# Patient Record
Sex: Male | Born: 1970 | Race: White | Hispanic: No | Smoking: Former smoker
Health system: Southern US, Community
[De-identification: ages and names within clinical notes are randomized; demographics above are authoritative.]

## PROBLEM LIST (undated history)

## (undated) DIAGNOSIS — F419 Anxiety disorder, unspecified: Secondary | ICD-10-CM

## (undated) DIAGNOSIS — I10 Essential (primary) hypertension: Secondary | ICD-10-CM

## (undated) HISTORY — DX: Anxiety disorder, unspecified: F41.9

## (undated) HISTORY — DX: Essential (primary) hypertension: I10

---

## 2000-05-14 ENCOUNTER — Emergency Department (HOSPITAL_COMMUNITY): Admission: EM | Admit: 2000-05-14 | Discharge: 2000-05-14 | Payer: Self-pay | Admitting: Emergency Medicine

## 2002-03-01 ENCOUNTER — Emergency Department (HOSPITAL_COMMUNITY): Admission: EM | Admit: 2002-03-01 | Discharge: 2002-03-02 | Payer: Self-pay

## 2002-03-11 ENCOUNTER — Emergency Department (HOSPITAL_COMMUNITY): Admission: EM | Admit: 2002-03-11 | Discharge: 2002-03-11 | Payer: Self-pay | Admitting: Emergency Medicine

## 2002-06-23 ENCOUNTER — Emergency Department (HOSPITAL_COMMUNITY): Admission: EM | Admit: 2002-06-23 | Discharge: 2002-06-23 | Payer: Self-pay | Admitting: Emergency Medicine

## 2005-04-30 ENCOUNTER — Emergency Department (HOSPITAL_COMMUNITY): Admission: EM | Admit: 2005-04-30 | Discharge: 2005-04-30 | Payer: Self-pay | Admitting: Emergency Medicine

## 2009-09-23 ENCOUNTER — Emergency Department (HOSPITAL_COMMUNITY): Admission: EM | Admit: 2009-09-23 | Discharge: 2009-09-23 | Payer: Self-pay | Admitting: Emergency Medicine

## 2010-05-26 IMAGING — CR DG CHEST 1V PORT
2 series · 2 of 2 positions shown · non-contrast
Comparison: None

CLINICAL DATA: Palpitations.

PORTABLE CHEST - 1 VIEW

[view not recorded (1 of 2)]
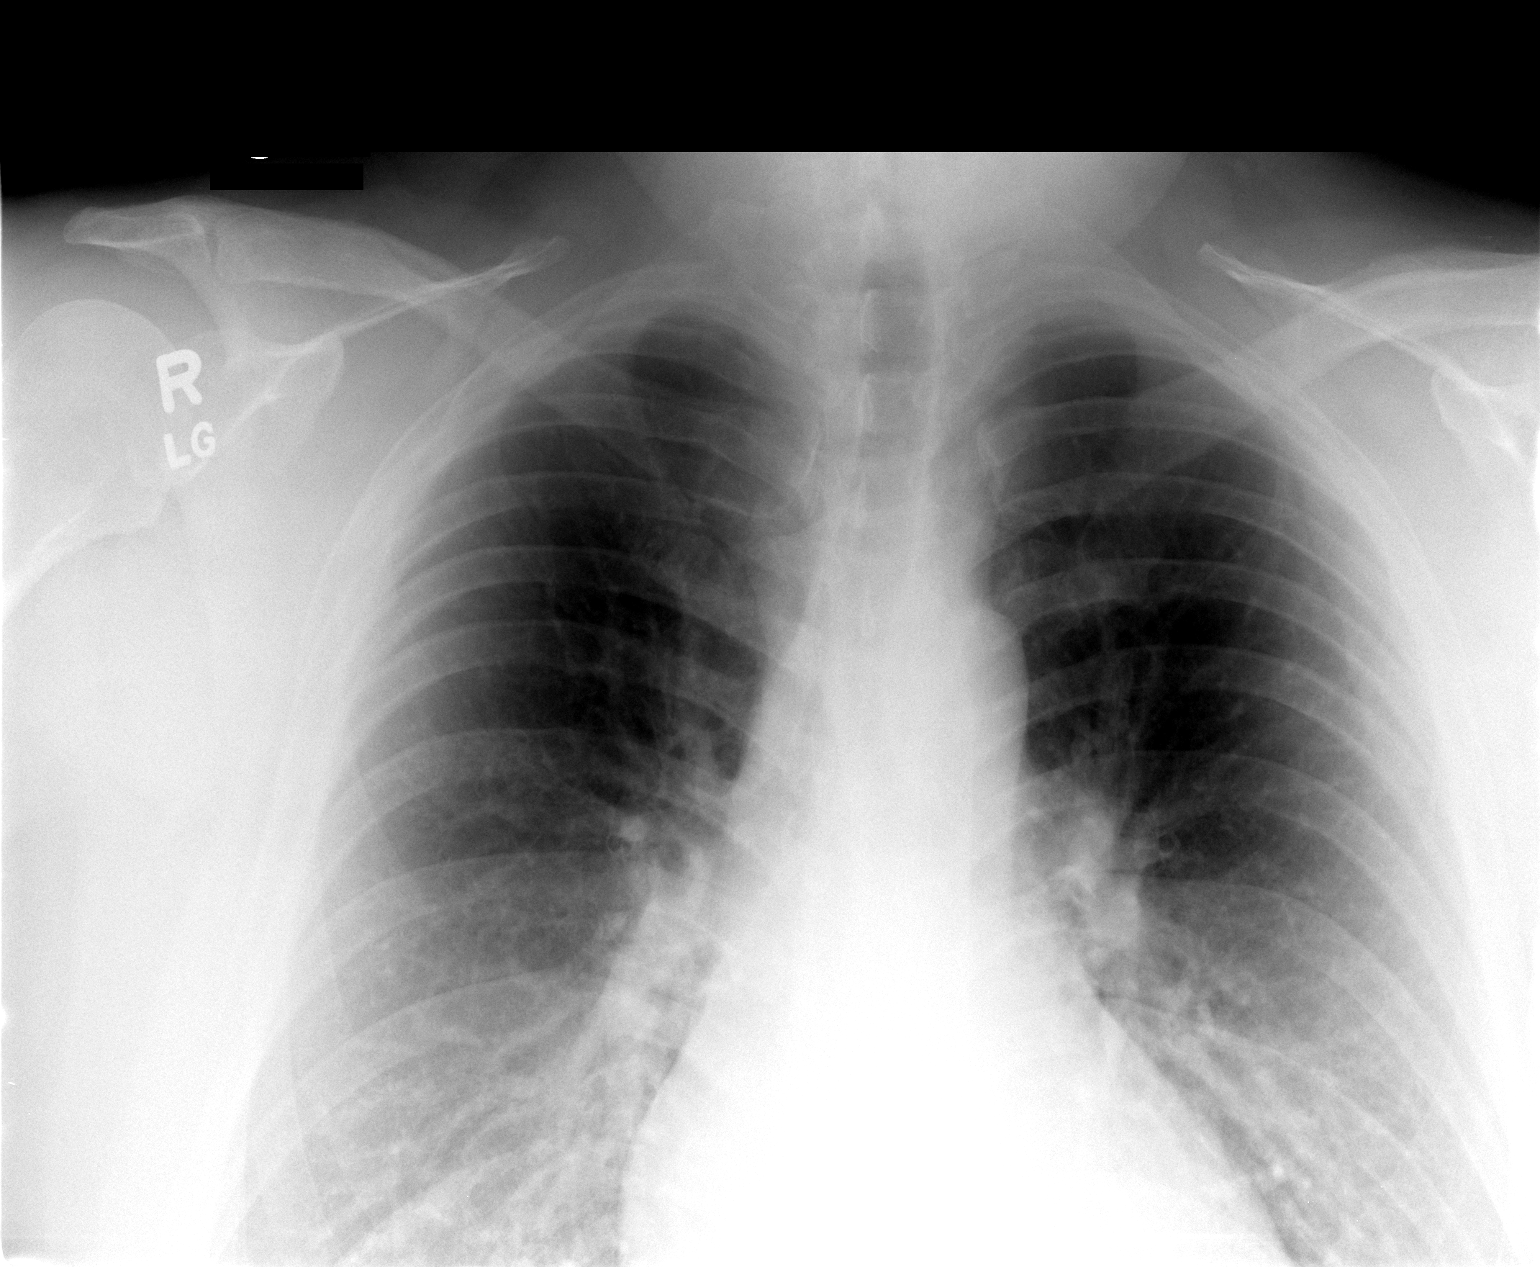

[view not recorded (2 of 2)]
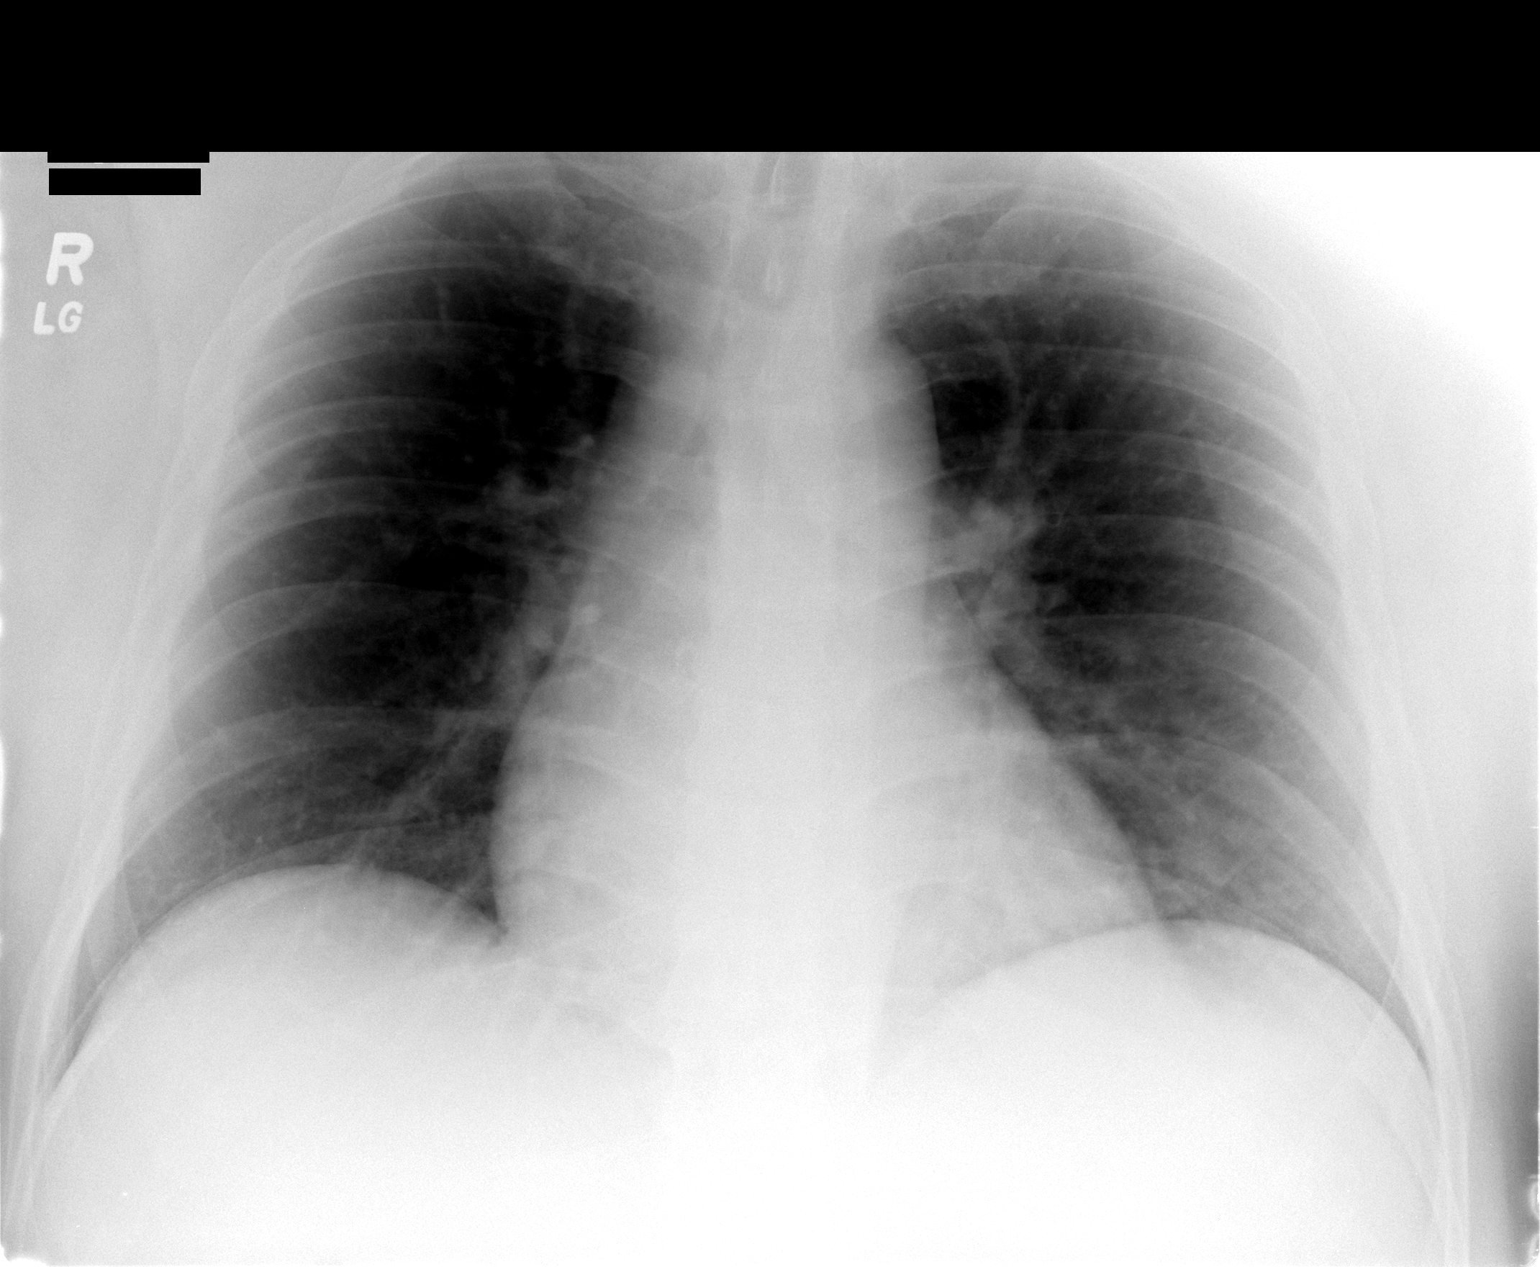

[2 of 2 positions shown; findings below may reference images not displayed]

FINDINGS: The lungs are well-aerated and clear.  There is no
evidence of focal opacification, pleural effusion or pneumothorax.

The cardiomediastinal silhouette appears mildly prominent, but this
may be within normal limits given the patient's size.  No acute
osseous abnormalities are seen.
IMPRESSION: No acute cardiopulmonary process seen.

## 2011-01-28 LAB — POCT I-STAT, CHEM 8
BUN: 13 mg/dL (ref 6–23)
Calcium, Ion: 1.2 mmol/L (ref 1.12–1.32)
Chloride: 103 mEq/L (ref 96–112)
Creatinine, Ser: 0.9 mg/dL (ref 0.4–1.5)
Glucose, Bld: 110 mg/dL — ABNORMAL HIGH (ref 70–99)
HCT: 39 % (ref 39.0–52.0)
Hemoglobin: 13.3 g/dL (ref 13.0–17.0)
Potassium: 3.5 mEq/L (ref 3.5–5.1)
Sodium: 140 mEq/L (ref 135–145)
TCO2: 24 mmol/L (ref 0–100)

## 2011-01-28 LAB — POCT CARDIAC MARKERS
CKMB, poc: <1 ng/mL — ABNORMAL LOW (ref 1.0–8.0)
CKMB, poc: <1 ng/mL — ABNORMAL LOW (ref 1.0–8.0)
Myoglobin, poc: 46.8 ng/mL (ref 12–200)
Myoglobin, poc: 64.1 ng/mL (ref 12–200)
Troponin i, poc: <0.05 ng/mL (ref 0.00–0.09)
Troponin i, poc: <0.05 ng/mL (ref 0.00–0.09)

## 2011-11-05 ENCOUNTER — Ambulatory Visit (INDEPENDENT_AMBULATORY_CARE_PROVIDER_SITE_OTHER): Payer: PRIVATE HEALTH INSURANCE

## 2011-11-05 DIAGNOSIS — Z23 Encounter for immunization: Secondary | ICD-10-CM

## 2011-11-05 DIAGNOSIS — M546 Pain in thoracic spine: Secondary | ICD-10-CM

## 2011-11-05 DIAGNOSIS — M62838 Other muscle spasm: Secondary | ICD-10-CM

## 2012-02-22 ENCOUNTER — Telehealth: Payer: Self-pay | Admitting: Internal Medicine

## 2012-02-22 MED ORDER — CLONAZEPAM 1 MG PO TABS: 1.0000 mg | ORAL_TABLET | Freq: Two times a day (BID) | ORAL | Status: DC | PRN

## 2012-02-22 NOTE — Telephone Encounter (Signed)
Refilled clonazepam 1 mg #30 no refills, due for recheck in May, script will be faxed.

## 2012-03-01 ENCOUNTER — Other Ambulatory Visit: Payer: Self-pay | Admitting: Physician Assistant

## 2012-03-01 MED ORDER — CLONAZEPAM 1 MG PO TABS: 1.0000 mg | ORAL_TABLET | Freq: Two times a day (BID) | ORAL | Status: DC | PRN

## 2012-05-21 ENCOUNTER — Other Ambulatory Visit: Payer: Self-pay | Admitting: Physician Assistant

## 2012-05-31 ENCOUNTER — Other Ambulatory Visit: Payer: Self-pay | Admitting: Physician Assistant

## 2012-05-31 MED ORDER — CLONAZEPAM 1 MG PO TABS: 1.0000 mg | ORAL_TABLET | Freq: Two times a day (BID) | ORAL | Status: DC | PRN

## 2012-09-01 ENCOUNTER — Other Ambulatory Visit: Payer: Self-pay | Admitting: Physician Assistant

## 2012-09-02 NOTE — Telephone Encounter (Signed)
Chart pulled to PA pool at nurses station [phone]

## 2012-09-03 ENCOUNTER — Other Ambulatory Visit: Payer: Self-pay | Admitting: *Deleted

## 2012-09-29 ENCOUNTER — Other Ambulatory Visit: Payer: Self-pay | Admitting: Physician Assistant

## 2012-10-11 ENCOUNTER — Ambulatory Visit (INDEPENDENT_AMBULATORY_CARE_PROVIDER_SITE_OTHER): Payer: PRIVATE HEALTH INSURANCE | Admitting: Physician Assistant

## 2012-10-11 VITALS — BP 121/75 | HR 80 | Temp 98.4°F | Resp 18 | Wt 277.0 lb

## 2012-10-11 DIAGNOSIS — F411 Generalized anxiety disorder: Secondary | ICD-10-CM

## 2012-10-11 DIAGNOSIS — E669 Obesity, unspecified: Secondary | ICD-10-CM

## 2012-10-11 DIAGNOSIS — F419 Anxiety disorder, unspecified: Secondary | ICD-10-CM

## 2012-10-11 DIAGNOSIS — Z719 Counseling, unspecified: Secondary | ICD-10-CM

## 2012-10-11 MED ORDER — CLONAZEPAM 1 MG PO TABS: 1.0000 mg | ORAL_TABLET | Freq: Two times a day (BID) | ORAL | Status: DC | PRN

## 2012-10-12 NOTE — Progress Notes (Signed)
  Subjective:    Patient ID: Richard Mcgrath, male    DOB: 07/31/1971, 41 y.o.   MRN: [id number]  HPI 41 yr old male presents for anxiety. He had been dating someone for 5 months and they were engaged to be married. And they broke up 2 days ago.  He is coping really well, but he is getting low on his clonazepam.  He is currently taking 2 tablets daily.  Eventually he would like to be off of it completely.  He has not been exercising or eating healthy.  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  He has kept this up.  Denies SI/HI. He gets his bloodwork done at his company and sends me copies.  Review of Systems  All other systems reviewed and are negative.       Objective:   Physical Exam  Nursing note and vitals reviewed. Constitutional: He is oriented to person, place, and time. He appears well-developed and well-nourished.  HENT:  Head: Normocephalic and atraumatic.  Cardiovascular: Normal rate.   Pulmonary/Chest: Effort normal.  Neurological: He is alert and oriented to person, place, and time.  Skin: Skin is warm and dry.  Psychiatric: He has a normal mood and affect. His behavior is normal. Thought content normal.      Assessment & Plan:  Anxiety-counseled and was face to face for >45 mins.  Advised disciplines such as exercise, proper eating, meditation and deep breathing for relaxation, resume formal counseling,etc.  Ok for clonazepam for 6 months. He will send me his bloodwork.

## 2012-10-30 ENCOUNTER — Other Ambulatory Visit: Payer: Self-pay | Admitting: Physician Assistant

## 2012-10-30 MED ORDER — OSELTAMIVIR PHOSPHATE 75 MG PO CAPS: 75.0000 mg | ORAL_CAPSULE | Freq: Two times a day (BID) | ORAL | Status: DC

## 2012-10-30 NOTE — Progress Notes (Signed)
Patient exposed to flu in our office 2 weeks ago. Developed fever, cough, congestion, sneezing, sore throat acutely yesterday. Patient with come in to be seen if he doesn't improve in 48 hrs, sooner if worse.

## 2012-11-01 ENCOUNTER — Ambulatory Visit (INDEPENDENT_AMBULATORY_CARE_PROVIDER_SITE_OTHER): Payer: PRIVATE HEALTH INSURANCE | Admitting: Physician Assistant

## 2012-11-01 ENCOUNTER — Encounter: Payer: Self-pay | Admitting: Physician Assistant

## 2012-11-01 VITALS — BP 128/82 | HR 67 | Temp 98.4°F | Resp 18 | Ht 75.0 in | Wt 277.0 lb

## 2012-11-01 DIAGNOSIS — J111 Influenza due to unidentified influenza virus with other respiratory manifestations: Secondary | ICD-10-CM

## 2012-11-01 DIAGNOSIS — J029 Acute pharyngitis, unspecified: Secondary | ICD-10-CM

## 2012-11-01 DIAGNOSIS — F419 Anxiety disorder, unspecified: Secondary | ICD-10-CM

## 2012-11-01 DIAGNOSIS — F411 Generalized anxiety disorder: Secondary | ICD-10-CM

## 2012-11-01 DIAGNOSIS — R509 Fever, unspecified: Secondary | ICD-10-CM

## 2012-11-01 LAB — POCT CBC
Granulocyte percent: 48 %G (ref 37–80)
MID (cbc): 0.5 (ref 0–0.9)
MPV: 9.2 fL (ref 0–99.8)
POC Granulocyte: 3.5 (ref 2–6.9)
POC MID %: 6.4 %M (ref 0–12)
Platelet Count, POC: 328 10*3/uL (ref 142–424)
RBC: 4.14 M/uL — AB (ref 4.69–6.13)

## 2012-11-01 NOTE — Progress Notes (Signed)
  Subjective:    Patient ID: Richard Mcgrath, male    DOB: 1971-02-06, 42 y.o.   MRN: [id number]  HPI 42 yr old CM presents with a 4-5 day h/o ST, congestion, slight cough, mild sneezing, and extreme fatigue.  First 2 days of illness he had a fever ~100.5.  I saw him in our office about 2 weeks ago for an anxiety check.  He was possibly exposed to flu at work and was possibly exposed in the office due to high number of flu cases.  He called to the office on Sunday and I empirically started him on Tamiflu based on his symptoms bc he had been sick just over 24 hrs.  He presents today with continued sore throat and fatigue.  He did have a flu shot in about October 2013.  He has not had any N/V/D. Denies SOB.  Review of Systems  All other systems reviewed and are negative.       Objective:   Physical Exam  Nursing note and vitals reviewed. Constitutional: He is oriented to person, place, and time. He appears well-developed and well-nourished.  HENT:  Head: Normocephalic.  Right Ear: External ear normal.  Left Ear: External ear normal.  Mouth/Throat: No oropharyngeal exudate (throat with erythema and no exudate).  Neck: Normal range of motion. Neck supple.  Cardiovascular: Normal rate, regular rhythm and normal heart sounds.   Pulmonary/Chest: Effort normal and breath sounds normal. No respiratory distress. He has no wheezes. He has no rales.  Lymphadenopathy:    He has no cervical adenopathy.  Neurological: He is alert and oriented to person, place, and time.  Skin: Skin is warm and dry.  Psychiatric: He has a normal mood and affect. His behavior is normal.     Results for orders placed in visit on 11/01/12  POCT CBC      Component Value Range   WBC 7.3  4.6 - 10.2 K/uL   Lymph, poc 3.3  0.6 - 3.4   POC LYMPH PERCENT 45.6  10 - 50 %L   MID (cbc) 0.5  0 - 0.9   POC MID % 6.4  0 - 12 %M   POC Granulocyte 3.5  2 - 6.9   Granulocyte percent 48.0  37 - 80 %G   RBC 4.14 (*) 4.69 - 6.13  M/uL   Hemoglobin 12.9 (*) 14.1 - 18.1 g/dL   HCT, POC 69.6 (*) 29.5 - 53.7 %   MCV 99.3 (*) 80 - 97 fL   MCH, POC 31.2  27 - 31.2 pg   MCHC 31.4 (*) 31.8 - 35.4 g/dL   RDW, POC 28.4     Platelet Count, POC 328  142 - 424 K/uL   MPV 9.2  0 - 99.8 fL  POCT RAPID STREP A (OFFICE)      Component Value Range   Rapid Strep A Screen Negative  Negative   Recheck pulse ox 97%     Assessment & Plan:  Resolving flu-continue to push fluids, rest.  Continue tamiflu.

## 2012-11-02 ENCOUNTER — Other Ambulatory Visit: Payer: Self-pay | Admitting: Physician Assistant

## 2012-11-08 ENCOUNTER — Other Ambulatory Visit: Payer: Self-pay

## 2012-11-08 ENCOUNTER — Other Ambulatory Visit: Payer: Self-pay | Admitting: Physician Assistant

## 2012-11-08 MED ORDER — DIPHENHYD-HYDROCORT-NYSTATIN MT SUSP: 5.0000 mL | Freq: Two times a day (BID) | OROMUCOSAL | Status: DC

## 2012-11-08 NOTE — Progress Notes (Signed)
Patient calls in, and has had bouts of oral thrush his whole adult life.  Rx sent.  RTC if it doesn't help, patient agrees.

## 2013-01-09 ENCOUNTER — Other Ambulatory Visit: Payer: Self-pay | Admitting: Physician Assistant

## 2013-01-09 NOTE — Telephone Encounter (Signed)
Forward to Angela.

## 2013-04-05 ENCOUNTER — Other Ambulatory Visit: Payer: Self-pay | Admitting: Physician Assistant

## 2013-04-05 NOTE — Telephone Encounter (Signed)
Forward to Sahand Gosch

## 2013-04-05 NOTE — Telephone Encounter (Signed)
rx printed. Please advise this patient that he is due for a follow-up visit for his next fill of this medication.  Also, please advise him that Ms. Sharon Seller is no longer here.  He will need to select an alternate provider.

## 2013-04-05 NOTE — Telephone Encounter (Signed)
Faxed Rx and advised pt of need for OV before next RF and need to choose a new provider. Pt agreed.

## 2013-05-24 ENCOUNTER — Ambulatory Visit: Payer: PRIVATE HEALTH INSURANCE

## 2013-07-24 ENCOUNTER — Ambulatory Visit (INDEPENDENT_AMBULATORY_CARE_PROVIDER_SITE_OTHER): Payer: PRIVATE HEALTH INSURANCE | Admitting: Family Medicine

## 2013-07-24 VITALS — BP 126/84 | HR 67 | Temp 98.4°F | Resp 17 | Ht 74.5 in | Wt 255.0 lb

## 2013-07-24 DIAGNOSIS — M79609 Pain in unspecified limb: Secondary | ICD-10-CM

## 2013-07-24 DIAGNOSIS — M549 Dorsalgia, unspecified: Secondary | ICD-10-CM

## 2013-07-24 DIAGNOSIS — F411 Generalized anxiety disorder: Secondary | ICD-10-CM

## 2013-07-24 DIAGNOSIS — M62830 Muscle spasm of back: Secondary | ICD-10-CM

## 2013-07-24 DIAGNOSIS — M5380 Other specified dorsopathies, site unspecified: Secondary | ICD-10-CM

## 2013-07-24 MED ORDER — CYCLOBENZAPRINE HCL 10 MG PO TABS: 10.0000 mg | ORAL_TABLET | Freq: Two times a day (BID) | ORAL | Status: AC | PRN

## 2013-07-24 MED ORDER — CLONAZEPAM 1 MG PO TABS: 1.0000 mg | ORAL_TABLET | Freq: Two times a day (BID) | ORAL | Status: DC | PRN

## 2013-07-24 NOTE — Progress Notes (Addendum)
[facility] [address] [place] [address] [phone]  Date:  07/24/2013   Name:  Richard Mcgrath   DOB:  1971-06-01   MRN:  [id number]  PCP:  Anne Ng    Chief Complaint: Back Pain   History of Present Illness:  Richard Mcgrath is a 42 y.o. very pleasant male patient who presents with the following:  Generally healthy except for anxiety.   Here today with a back problem.  About a month ago he started to notice a problem with his back.  He was re- racking some weights and felt sudden pain in his back. It was hurting on both sides- he rested and eventually felt better.  However, the pain returned on the left side only 4 days ago while he was driving to [place].   Yesterday am the pain got worse when he moved suddenly.   His GF loaned him her TENS unit which did help.  He is also taking some leftover skelaxin and ibuprofen 800- he is not sure if these are helping except the skelaxin does help him sleep.    The pain does run down the back of his left buttock.   No numbness or weakness of his back, no bowel or bladder incontinence.   He has quit smoking and greatly improved his cholesterol by weight loss.    He takes clonopin for anxiety- twice a day.   He will occasionally have a panic attack- more if he does not take his medication.  He has been taking this for about 20 years.   He has taken flexeril in the past.    Patient Active Problem List   Diagnosis Date Noted  . Anxiety 11/01/2012    Past Medical History  Diagnosis Date  . Anxiety   . Hypertension     History reviewed. No pertinent past surgical history.  History  Substance Use Topics  . Smoking status: Former Games developer  . Smokeless tobacco: Not on file  . Alcohol Use: No    Family History  Problem Relation Age of Onset  . Cancer Mother   . Stroke Mother   . Stroke Father   . Cancer Father   . Cancer Maternal Grandmother   . Heart disease Maternal Grandfather   .  Cancer Paternal Grandfather     No Known Allergies  Medication list has been reviewed and updated.  Current Outpatient Prescriptions on File Prior to Visit  Medication Sig Dispense Refill  . clonazePAM (KLONOPIN) 1 MG tablet TAKE 1 TABLET BY MOUTH TWICE DAILY AS NEEDED  180 tablet  0   No current facility-administered medications on file prior to visit.    Review of Systems:  As per HPI- otherwise negative.   Physical Examination: Filed Vitals:   07/24/13 1340  BP: 126/84  Pulse: 67  Temp: 98.4 F (36.9 C)  Resp: 17   Filed Vitals:   07/24/13 1340  Height: 6' 2.5" (1.892 m)  Weight: 255 lb (115.667 kg)   Body mass index is 32.31 kg/(m^2). Ideal Body Weight: Weight in (lb) to have BMI = 25: 196.9  GEN: WDWN, NAD, Non-toxic, A & O x 3, tall, mild overweight HEENT: Atraumatic, Normocephalic. Neck supple. No masses, No LAD.  PEERL Ears and Nose: No external deformity. CV: RRR, No M/G/R. No JVD. No thrill. No extra heart sounds. PULM: CTA B, no wheezes, crackles, rhonchi. No retractions. No resp. distress. No accessory muscle use.Marland Kitchen EXTR: No c/c/e NEURO Normal  gait.  PSYCH: Normally interactive. Conversant. Not depressed or anxious appearing.  Calm demeanor.  Back: tender in the muscles of the lower back, left more than right.  negative SLR bilaterally, normal LE strength, sensation and DTR bilaterally.  Good back flexion and extension, normal toe raise   Assessment and Plan: Back pain - Plan: cyclobenzaprine (FLEXERIL) 10 MG tablet  Muscle spasm of back  Generalized anxiety disorder - Plan: clonazePAM (KLONOPIN) 1 MG tablet   Muscle spasm of back.  He has been on clonazepam for over 20 years and has used it with flexeril in the past without ill effect.   However, cautioned him regarding sedation and recommended that he 1/2 his clonazepam dose while on the muscle relaxer.  He will let me know if not better in the next few days. We discussed and he declined x-rays  today.    Signed Abbe Amsterdam, MD

## 2013-07-24 NOTE — Addendum Note (Signed)
Addended by: Abbe Amsterdam C on: 07/24/2013 05:38 PM   Modules accepted: Orders

## 2013-07-24 NOTE — Patient Instructions (Addendum)
Use the flexeril as needed- be cautious of sedation with this medication, especially in combination with your clonazepam.  You may want to take just half of your usual klonopin while you are on flexeril.   Try to keep moving to avoid stiffness of your back.    Let me know if you are not better in the next few days- Sooner if worse.

## 2013-11-02 ENCOUNTER — Other Ambulatory Visit: Payer: Self-pay | Admitting: Family Medicine

## 2013-11-02 DIAGNOSIS — F411 Generalized anxiety disorder: Secondary | ICD-10-CM

## 2013-12-18 ENCOUNTER — Other Ambulatory Visit: Payer: Self-pay | Admitting: Physician Assistant

## 2013-12-18 DIAGNOSIS — F411 Generalized anxiety disorder: Secondary | ICD-10-CM

## 2013-12-20 NOTE — Telephone Encounter (Signed)
I was confused by his early refill requeust- however it turns out he only received #60 in January as his new rx provider only fills 30 days supplies locally.   Meds ordered this encounter  Medications  . clonazePAM (KLONOPIN) 1 MG tablet    Sig: TAKE 1 TABLET BY MOUTH TWICE DAILY AS NEEDED    Dispense:  60 tablet    Refill:  3

## 2014-05-30 ENCOUNTER — Other Ambulatory Visit: Payer: Self-pay | Admitting: Family Medicine

## 2014-05-30 DIAGNOSIS — F411 Generalized anxiety disorder: Secondary | ICD-10-CM

## 2014-05-31 NOTE — Telephone Encounter (Signed)
Dr Patsy Lageropland, you haven't seen pt since last Sept, but I pended RF w/note that he needs OV in case you wanted to give 1 more.

## 2014-05-31 NOTE — Telephone Encounter (Signed)
Called in rx for him- however also called and spoke with him.  [place] and plans to establish with an MD there.  He will call and request his records.

## 2014-06-30 ENCOUNTER — Other Ambulatory Visit: Payer: Self-pay | Admitting: Family Medicine

## 2014-06-30 DIAGNOSIS — F411 Generalized anxiety disorder: Secondary | ICD-10-CM

## 2014-07-02 NOTE — Telephone Encounter (Signed)
Dr Patsy Lager, last mos we gave pt 1 mos RF w/note needs OV for more. Do you want to refuse this?

## 2014-07-02 NOTE — Telephone Encounter (Signed)
Called and LMOM. I will give #30, but he will need to be seen prior to more.  This is his second notice

## 2014-07-27 ENCOUNTER — Other Ambulatory Visit: Payer: Self-pay | Admitting: Family Medicine

## 2014-07-27 NOTE — Telephone Encounter (Signed)
Pt not seen in over a year.  He does not need a RF, he plans to see his new PCP in [place], where he lives now

## 2018-03-31 ENCOUNTER — Ambulatory Visit (INDEPENDENT_AMBULATORY_CARE_PROVIDER_SITE_OTHER): Payer: Commercial Managed Care - PPO | Admitting: Otolaryngology

## 2018-03-31 DIAGNOSIS — H903 Sensorineural hearing loss, bilateral: Secondary | ICD-10-CM

## 2018-03-31 DIAGNOSIS — H6983 Other specified disorders of Eustachian tube, bilateral: Secondary | ICD-10-CM | POA: Diagnosis not present

## 2018-03-31 DIAGNOSIS — H9313 Tinnitus, bilateral: Secondary | ICD-10-CM | POA: Diagnosis not present

## 2018-06-09 ENCOUNTER — Ambulatory Visit (INDEPENDENT_AMBULATORY_CARE_PROVIDER_SITE_OTHER): Payer: Commercial Managed Care - PPO | Admitting: Otolaryngology
